# Patient Record
Sex: Male | Born: 1975 | Race: White | Hispanic: No | Marital: Single | Smoking: Current every day smoker
Health system: Southern US, Community
[De-identification: ages and names within clinical notes are randomized; demographics above are authoritative.]

---

## 2007-08-11 ENCOUNTER — Emergency Department: Payer: Self-pay | Admitting: Emergency Medicine

## 2008-01-28 ENCOUNTER — Emergency Department: Payer: Self-pay | Admitting: Emergency Medicine
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
## 2010-06-17 ENCOUNTER — Emergency Department: Payer: Self-pay | Admitting: Unknown Physician Specialty

## 2016-06-23 ENCOUNTER — Emergency Department
Admission: EM | Admit: 2016-06-23 | Discharge: 2016-06-23 | Disposition: A | Discharge status: Home / Self Care | Payer: Self-pay | Attending: Emergency Medicine | Admitting: Emergency Medicine

## 2016-06-23 ENCOUNTER — Encounter: Payer: Self-pay | Admitting: *Deleted

## 2016-06-23 DIAGNOSIS — K047 Periapical abscess without sinus: Secondary | ICD-10-CM | POA: Insufficient documentation

## 2016-06-23 DIAGNOSIS — F17210 Nicotine dependence, cigarettes, uncomplicated: Secondary | ICD-10-CM | POA: Insufficient documentation

## 2016-06-23 DIAGNOSIS — F1290 Cannabis use, unspecified, uncomplicated: Secondary | ICD-10-CM | POA: Insufficient documentation

## 2016-06-23 MED ORDER — AMOXICILLIN 500 MG PO CAPS: 500.0000 mg | ORAL_CAPSULE | Freq: Three times a day (TID) | ORAL | 0 refills | Status: AC

## 2016-06-23 MED ORDER — LIDOCAINE VISCOUS 2 % MT SOLN: 20.0000 mL | Freq: Four times a day (QID) | OROMUCOSAL | 0 refills | Status: AC | PRN

## 2016-06-23 MED ORDER — TRAMADOL HCL 50 MG PO TABS: 50.0000 mg | ORAL_TABLET | Freq: Four times a day (QID) | ORAL | 0 refills | Status: AC | PRN

## 2016-06-23 NOTE — ED Provider Notes (Signed)
[facility] Provider Note   CSN: [id number] Arrival date & time: 06/23/16  0809     History   Chief Complaint Chief Complaint  Patient presents with  . Dental Problem    HPI Cameron Le is a 40 y.o. male.  40 year old male presents today complaining of right lower jaw pain and swelling. Has a history of dental abscesses with similar symptoms. Seen by a dentist in [place] last month for similar symptoms and had some teeth pulled. No fevers, sweats or chills. No difficulty swallowing.    The history is provided by the patient.    History reviewed. No pertinent past medical history.  There are no active problems to display for this patient.   History reviewed. No pertinent surgical history.     Home Medications    Prior to Admission medications   Medication Sig Start Date End Date Taking? Authorizing Provider  amoxicillin (AMOXIL) 500 MG capsule Take 1 capsule (500 mg total) by mouth 3 (three) times daily. 06/23/16 06/30/16  Harvest Dark, PA-C  lidocaine (XYLOCAINE) 2 % solution Use as directed 20 mLs in the mouth or throat every 6 (six) hours as needed (dental pain). 06/23/16   Harvest Dark, PA-C  traMADol (ULTRAM) 50 MG tablet Take 1 tablet (50 mg total) by mouth every 6 (six) hours as needed. 06/23/16 06/23/17  Harvest Dark, PA-C    Family History No family history on file.  Social History Social History  Substance Use Topics  . Smoking status: Never Smoker  . Smokeless tobacco: Not on file  . Alcohol use No     Allergies   Ibuprofen   Review of Systems Review of Systems  Constitutional: Negative for chills and fever.  HENT: Positive for dental problem.   All other systems reviewed and are negative.    Physical Exam Updated Vital Signs BP (!) 152/95 (BP Location: Right Arm)   Pulse 73   Temp 98.1 F (36.7 C) (Oral)   Resp 18   Ht 5' 10"  (1.778 m)   Wt 68 kg   SpO2 99%   BMI 21.52 kg/m   Physical Exam    Constitutional: He appears well-developed and well-nourished.  HENT:  Head: Normocephalic and atraumatic.  Mouth/Throat: Uvula is midline, oropharynx is clear and moist and mucous membranes are normal. No trismus in the jaw. Abnormal dentition. Dental abscesses and dental caries present. No uvula swelling. No oropharyngeal exudate, posterior oropharyngeal edema or posterior oropharyngeal erythema.    Eyes: Conjunctivae are normal.  Neck: Normal range of motion. Neck supple.  Lymphadenopathy:    He has no cervical adenopathy.  Skin: Skin is warm and dry.  Psychiatric: He has a normal mood and affect. His behavior is normal. Judgment and thought content normal.     ED Treatments / Results  Labs (all labs ordered are listed, but only abnormal results are displayed) Labs Reviewed - No data to display  EKG  EKG Interpretation None       Radiology No results found.  Procedures Procedures (including critical care time)  Medications Ordered in ED Medications - No data to display   Initial Impression / Assessment and Plan / ED Course  I have reviewed the triage vital signs and the nursing notes.  Pertinent labs & imaging results that were available during my care of the patient were reviewed by me and considered in my medical decision making (see chart for details).  Clinical Course    Amoxicillin  549m TID x 7 days Viscous lido swish and spit Ultram 550m4-6 hr #20 no refills. Emphasized importance of follow up with dentist. Return for new or worsening symptoms   Final Clinical Impressions(s) / ED Diagnoses   Final diagnoses:  Dental abscess    New Prescriptions Discharge Medication List as of 06/23/2016  8:38 AM    START taking these medications   Details  amoxicillin (AMOXIL) 500 MG capsule Take 1 capsule (500 mg total) by mouth 3 (three) times daily., Starting Tue 06/23/2016, Until Tue 06/30/2016, Print    lidocaine (XYLOCAINE) 2 % solution Use as directed 20  mLs in the mouth or throat every 6 (six) hours as needed (dental pain)., Starting Tue 06/23/2016, Print    traMADol (ULTRAM) 50 MG tablet Take 1 tablet (50 mg total) by mouth every 6 (six) hours as needed., Starting Tue 06/23/2016, Until Wed 06/23/2017, Print         EmHarvest DarkPA-C 06/23/16 [id number]  JoEarleen NewportMD 06/23/16 1014

## 2016-06-23 NOTE — ED Triage Notes (Signed)
My complains of right sided dental pain with swelling to right side of face

## 2019-02-15 ENCOUNTER — Emergency Department
Admission: EM | Admit: 2019-02-15 | Discharge: 2019-02-15 | Disposition: A | Discharge status: Home / Self Care | Payer: Self-pay | Attending: Emergency Medicine | Admitting: Emergency Medicine

## 2019-02-15 ENCOUNTER — Other Ambulatory Visit: Payer: Self-pay

## 2019-02-15 ENCOUNTER — Encounter: Payer: Self-pay | Admitting: *Deleted

## 2019-02-15 ENCOUNTER — Emergency Department: Payer: Self-pay

## 2019-02-15 DIAGNOSIS — F17210 Nicotine dependence, cigarettes, uncomplicated: Secondary | ICD-10-CM | POA: Insufficient documentation

## 2019-02-15 DIAGNOSIS — R0602 Shortness of breath: Secondary | ICD-10-CM | POA: Insufficient documentation

## 2019-02-15 DIAGNOSIS — R079 Chest pain, unspecified: Secondary | ICD-10-CM | POA: Insufficient documentation

## 2019-02-15 DIAGNOSIS — Y908 Blood alcohol level of 240 mg/100 ml or more: Secondary | ICD-10-CM | POA: Insufficient documentation

## 2019-02-15 DIAGNOSIS — F1010 Alcohol abuse, uncomplicated: Secondary | ICD-10-CM | POA: Insufficient documentation

## 2019-02-15 LAB — COMPREHENSIVE METABOLIC PANEL
ALT: 22 U/L (ref 0–44)
AST: 27 U/L (ref 15–41)
Albumin: 4.6 g/dL (ref 3.5–5.0)
Alkaline Phosphatase: 59 U/L (ref 38–126)
Anion gap: 12 (ref 5–15)
BUN: 5 mg/dL — ABNORMAL LOW (ref 6–20)
CO2: 23 mmol/L (ref 22–32)
Calcium: 9.2 mg/dL (ref 8.9–10.3)
Chloride: 99 mmol/L (ref 98–111)
Creatinine, Ser: 0.67 mg/dL (ref 0.61–1.24)
GFR calc Af Amer: >60 mL/min (ref >60)
GFR calc non Af Amer: >60 mL/min (ref >60)
Glucose, Bld: 96 mg/dL (ref 70–99)
Potassium: 4.1 mmol/L (ref 3.5–5.1)
Sodium: 134 mmol/L — ABNORMAL LOW (ref 135–145)
Total Bilirubin: 0.5 mg/dL (ref 0.3–1.2)
Total Protein: 8.5 g/dL — ABNORMAL HIGH (ref 6.5–8.1)

## 2019-02-15 LAB — CBC WITH DIFFERENTIAL/PLATELET
Abs Immature Granulocytes: 0.02 10*3/uL (ref 0.00–0.07)
Basophils Absolute: 0.1 10*3/uL (ref 0.0–0.1)
Basophils Relative: 1 %
Eosinophils Absolute: 0.2 10*3/uL (ref 0.0–0.5)
Eosinophils Relative: 2 %
HCT: 44.9 % (ref 39.0–52.0)
Hemoglobin: 15.7 g/dL (ref 13.0–17.0)
Immature Granulocytes: 0 %
Lymphocytes Relative: 46 %
Lymphs Abs: 4.4 10*3/uL — ABNORMAL HIGH (ref 0.7–4.0)
MCH: 33.3 pg (ref 26.0–34.0)
MCHC: 35 g/dL (ref 30.0–36.0)
MCV: 95.1 fL (ref 80.0–100.0)
Monocytes Absolute: 0.6 10*3/uL (ref 0.1–1.0)
Monocytes Relative: 7 %
Neutro Abs: 4.2 10*3/uL (ref 1.7–7.7)
Neutrophils Relative %: 44 %
Platelets: 280 10*3/uL (ref 150–400)
RBC: 4.72 MIL/uL (ref 4.22–5.81)
RDW: 11.9 % (ref 11.5–15.5)
WBC: 9.4 10*3/uL (ref 4.0–10.5)
nRBC: 0 % (ref 0.0–0.2)

## 2019-02-15 LAB — URINE DRUG SCREEN, QUALITATIVE (ARMC ONLY)
Amphetamines, Ur Screen: NOT DETECTED
Barbiturates, Ur Screen: NOT DETECTED
Benzodiazepine, Ur Scrn: NOT DETECTED
Cannabinoid 50 Ng, Ur ~~LOC~~: NOT DETECTED
Cocaine Metabolite,Ur ~~LOC~~: NOT DETECTED
MDMA (Ecstasy)Ur Screen: NOT DETECTED
Methadone Scn, Ur: NOT DETECTED
Opiate, Ur Screen: NOT DETECTED
Phencyclidine (PCP) Ur S: NOT DETECTED
Tricyclic, Ur Screen: NOT DETECTED

## 2019-02-15 LAB — BRAIN NATRIURETIC PEPTIDE: B Natriuretic Peptide: 19 pg/mL (ref 0.0–100.0)

## 2019-02-15 LAB — ETHANOL: Alcohol, Ethyl (B): 247 mg/dL — ABNORMAL HIGH (ref <10)

## 2019-02-15 LAB — TROPONIN I: Troponin I: <0.03 ng/mL (ref <0.03)

## 2019-02-15 NOTE — ED Notes (Signed)
Rn instructed pt not to drive. Pt stated his son would pick him up.

## 2019-02-15 NOTE — Discharge Instructions (Signed)
Do not drive home, do not drink alcohol to excess, call the doctors above for outpatient care of this if you have increased pain shortness of breath or other concerns return to the emergency department.

## 2019-02-15 NOTE — ED Notes (Signed)
Pt can be heard yelling in his room

## 2019-02-15 NOTE — ED Notes (Signed)
Respirations appear even and unlabored but pt has trouble getting words out and speaking, this is not a new problem though

## 2019-02-15 NOTE — ED Notes (Signed)
PT wants to leave and just have results called to him. This RN encouraged pt to wait to speak with MD.

## 2019-02-15 NOTE — ED Triage Notes (Signed)
Pt ambulatory to triage.  Pt has sob and chest pain for 2-3 years.  Pt states he had a dream this week and was having chest pain and sob and thinks he is worse now.   Pt reports exertional sob.   Pt alert

## 2019-02-15 NOTE — ED Provider Notes (Addendum)
.[facility] Emergency Department Provider Note  ____________________________________________   I have reviewed the triage vital signs and the nursing notes. Where available I have reviewed prior notes and, if possible and indicated, outside hospital notes.    HISTORY  Chief Complaint Shortness of Breath    HPI Cameron Le is a 43 y.o. male who presents today complaining of chest pain and shortness of breath which is been going on for 3 years.  Mostly at night.  Wakes up and feels better.  He denies any exertional symptoms he is able to do his job with no difficulty.  He cannot describe what the pain is like.  He is a very poor historian.  He states "it just hurts sometimes".  He is not having the pain today.  He has been very anxious about it recently for various reasons and decided to come in to get checked out.  He denies any leg swelling, he denies any nausea or vomiting, he denies any weight loss or other symptoms.  He does drink 4-5 beers a day.  He is had some today.  He does not have a history of alcohol withdrawal.  He states he has a hoarse voice for "years" and is not different..  He has no pleuritic pain he has no personal family history of PE or DVT is no recent travel no leg swelling.  He does state sometimes at night he feels that there is pain coming on while he sleeps and when he wakes up it seems to get better.   No past medical history on file.  There are no active problems to display for this patient.   No past surgical history on file.  Prior to Admission medications   Medication Sig Start Date End Date Taking? Authorizing Provider  lidocaine (XYLOCAINE) 2 % solution Use as directed 20 mLs in the mouth or throat every 6 (six) hours as needed (dental pain). 06/23/16   Harvest Dark, PA-C    Allergies Ibuprofen  No family history on file.  Social History Social History   Tobacco Use  . Smoking status: Current Every Day  Smoker    Types: Cigarettes  . Smokeless tobacco: Never Used  Substance Use Topics  . Alcohol use: Yes    Comment: daily  . Drug use: Yes    Types: Marijuana    Review of Systems Constitutional: No fever/chills Eyes: No visual changes. ENT: No sore throat. No stiff neck no neck pain Cardiovascular: See HPI regarding chest pain. Respiratory: See HPI regarding shortness of breath. Gastrointestinal:   no vomiting.  No diarrhea.  No constipation. Genitourinary: Negative for dysuria. Musculoskeletal: Negative lower extremity swelling Skin: Negative for rash. Neurological: Negative for severe headaches, focal weakness or numbness.   ____________________________________________   PHYSICAL EXAM:  VITAL SIGNS: ED Triage Vitals [02/15/19 1751]  Enc Vitals Group     BP (!) 200/172     Pulse Rate 89     Resp 20     Temp 98.6 F (37 C)     Temp Source Oral     SpO2 99 %     Weight 145 lb (65.8 kg)     Height 5' 10"  (1.778 m)     Head Circumference      Peak Flow      Pain Score 10     Pain Loc      Pain Edu?      Excl. in [place]?  Constitutional: Alert and oriented. Well appearing and in no acute distress.  Quite anxious but not in any way toxic in appearance Eyes: Conjunctivae are normal Head: Atraumatic HEENT: No congestion/rhinnorhea. Mucous membranes are moist.  Oropharynx non-erythematous Neck:   Nontender with no meningismus, no masses, no stridor I do not palpate any masses Cardiovascular: Normal rate, regular rhythm. Grossly normal heart sounds.  Good peripheral circulation. Respiratory: Normal respiratory effort.  No retractions. Lungs CTAB. Abdominal: Soft and nontender. No distention. No guarding no rebound Back:  There is no focal tenderness or step off.  there is no midline tenderness there are no lesions noted. there is no CVA tenderness Musculoskeletal: No lower extremity tenderness, no upper extremity tenderness. No joint effusions, no DVT signs strong  distal pulses no edema Neurologic:  Normal speech and language. No gross focal neurologic deficits are appreciated.  Skin:  Skin is warm, dry and intact. No rash noted. Psychiatric: Mood and affect are anxious. Speech and behavior are normal.  ____________________________________________   LABS (all labs ordered are listed, but only abnormal results are displayed)  Labs Reviewed  CBC WITH DIFFERENTIAL/PLATELET  TROPONIN I  COMPREHENSIVE METABOLIC PANEL  ETHANOL  URINE DRUG SCREEN, QUALITATIVE ([place])  BRAIN NATRIURETIC PEPTIDE    Pertinent labs  results that were available during my care of the patient were reviewed by me and considered in my medical decision making (see chart for details). ____________________________________________  EKG  I personally interpreted any EKGs ordered by me or triage Sinus rhythm rate 83 beats minute no acute ST elevation depression normal axis, ____________________________________________  RADIOLOGY  Pertinent labs & imaging results that were available during my care of the patient were reviewed by me and considered in my medical decision making (see chart for details). If possible, patient and/or family made aware of any abnormal findings.  No results found. ____________________________________________    PROCEDURES  Procedure(s) performed: None  Procedures  Critical Care performed: None  ____________________________________________   INITIAL IMPRESSION / ASSESSMENT AND PLAN / ED COURSE  Pertinent labs & imaging results that were available during my care of the patient were reviewed by me and considered in my medical decision making (see chart for details).  Patient with chest pain mostly at night for 3 years, is very anxious about it.  Had a dream about him.  Wants to be evaluated.  He is awake and alert no acute distress, no evidence of acute psychiatric or other decompensation although he does express a great deal of concern  about his chronic poorly described chest pain.  Low suspicion for ACS PE or dissection given his history we will send cardiac enzymes get chest x-ray or obtain EKG basic blood work including BNP and we will see if there is anything that needs to be done today.  If not, given chronicity of complaint, it is my hope that we can get adequate outpatient cardiology follow-up for stress echo in the future  ----------------------------------------- 8:10 PM on 02/15/2019 -----------------------------------------  Allergy work-up is negative however patient's EtOH is certainly quite positive which I think probably contributes to his anxiety that his chronic discomfort in his chest.  We will observe him to make sure he is safe for discharge I do not think serial enzymes are utility.  Patient in no acute distress it is my hope we can get him safely home soon.  ----------------------------------------- 8:36 PM on 02/15/2019 -----------------------------------------  Patient has a ride waiting in the parking lot it is demanding discharge he is  awake and alert able to ambulate, I think clinically he is safe for discharge from terms of his intoxication, he refuses to stay and I do not think I am in a position to force him to stay here especially given that there may be other patients who are infected with the coronavirus in the department.  We will discharge him therefore with his son to drive him.  He is understanding the return precautions follow-up disposition instructions and he understands he must not drive    ____________________________________________   FINAL CLINICAL IMPRESSION(S) / ED DIAGNOSES  Final diagnoses:  None      This chart was dictated using voice recognition software.  Despite best efforts to proofread,  errors can occur which can change meaning.      Schuyler Amor, MD 02/15/19 2010    Schuyler Amor, MD 02/15/19 2036

## 2019-04-19 ENCOUNTER — Emergency Department
Admission: EM | Admit: 2019-04-19 | Discharge: 2019-04-19 | Discharge status: Against Medical Advice | Payer: Self-pay | Attending: Emergency Medicine | Admitting: Emergency Medicine

## 2019-04-19 DIAGNOSIS — Z5321 Procedure and treatment not carried out due to patient leaving prior to being seen by health care provider: Secondary | ICD-10-CM | POA: Insufficient documentation

## 2019-04-19 DIAGNOSIS — T63511A Toxic effect of contact with stingray, accidental (unintentional), initial encounter: Secondary | ICD-10-CM | POA: Insufficient documentation

## 2019-04-19 NOTE — ED Notes (Signed)
Patient states he does not want to wait for MD. States he is going to leave. Advised that MD will see patient. He states he does not want to be seen. Removed patients wrist band. Patient ambulated out of ED with steady gate

## 2019-10-03 ENCOUNTER — Other Ambulatory Visit: Payer: Self-pay

## 2019-10-03 DIAGNOSIS — Z20822 Contact with and (suspected) exposure to covid-19: Secondary | ICD-10-CM

## 2019-10-05 LAB — NOVEL CORONAVIRUS, NAA: SARS-CoV-2, NAA: NOT DETECTED

## 2020-01-31 ENCOUNTER — Encounter (INDEPENDENT_AMBULATORY_CARE_PROVIDER_SITE_OTHER): Payer: Self-pay

## 2020-03-21 IMAGING — DX PORTABLE CHEST - 1 VIEW
1 series · 1 of 1 positions shown · non-contrast
Comparison: None.

CLINICAL DATA: 42-year-old male with shortness of breath and chest
pain

EXAM:
PORTABLE CHEST 1 VIEW

[chest ap]
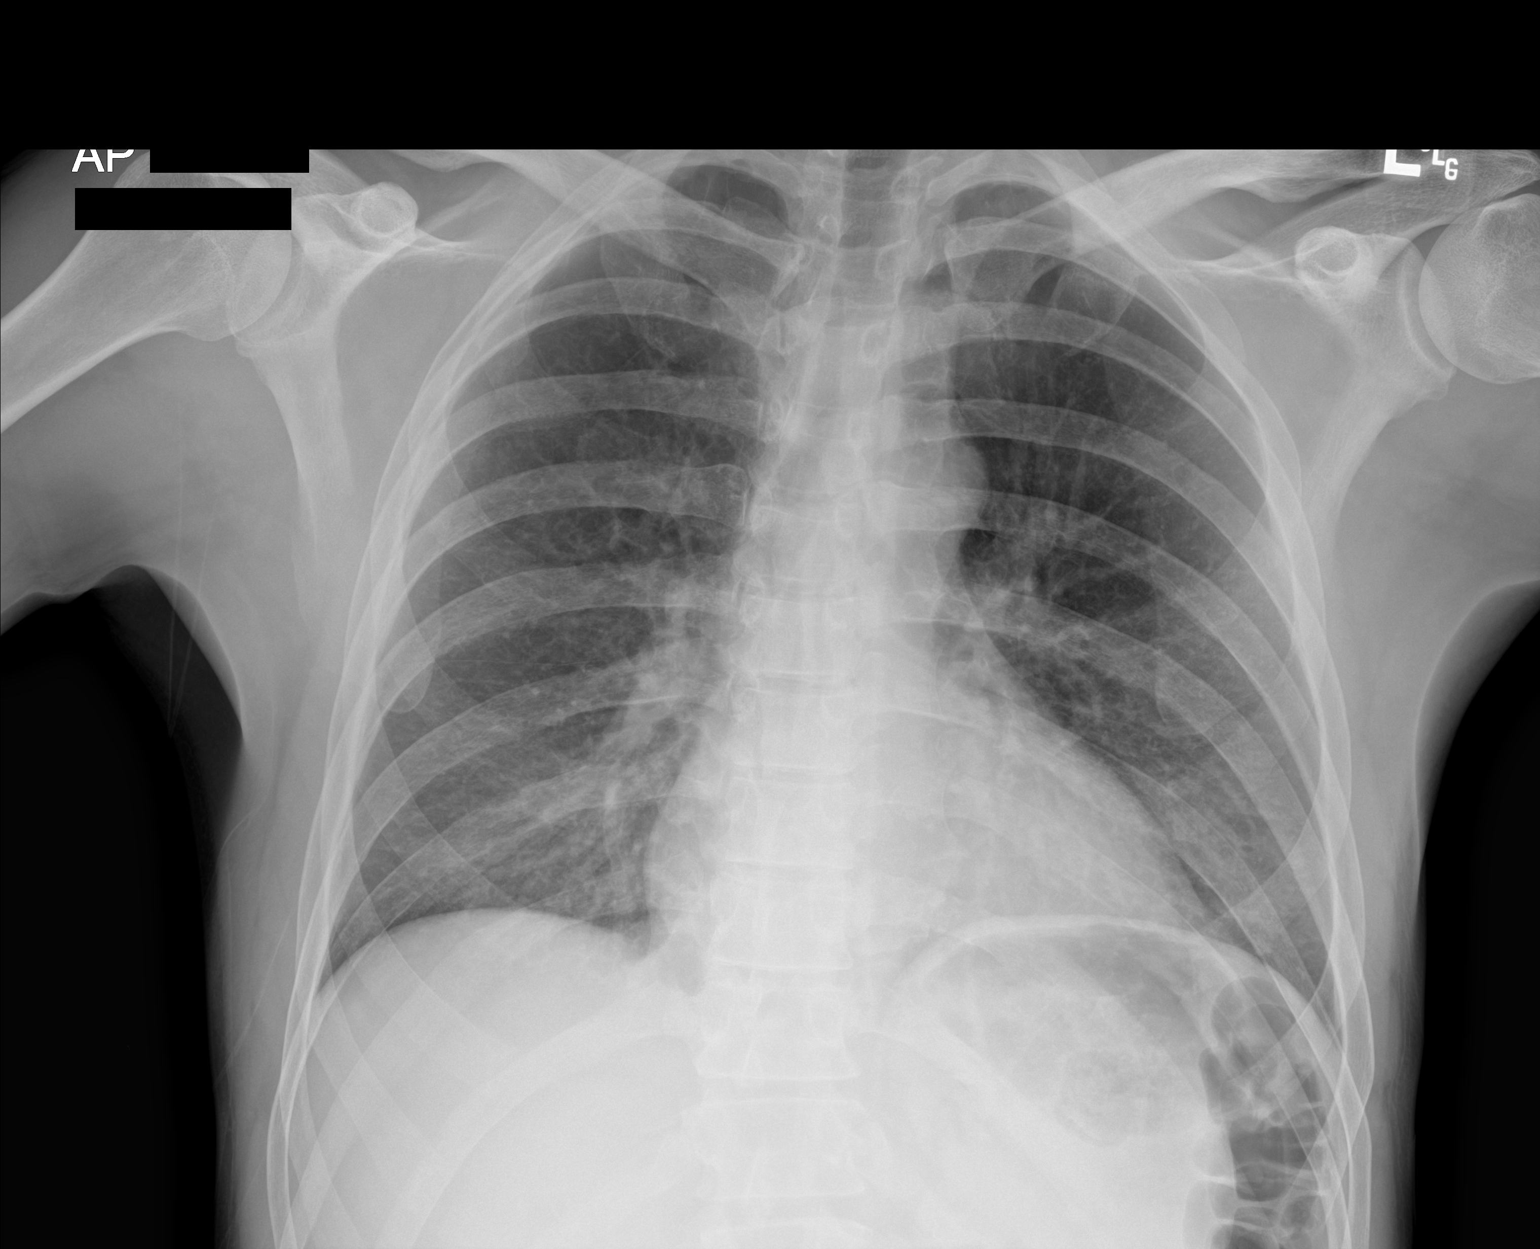

[1 of 1 positions shown; findings below may reference images not displayed]

FINDINGS: Cardiomediastinal silhouette within normal limits. No evidence of
central vascular congestion. No pneumothorax. No pleural effusion.
Reticular opacities of the lower lungs. No comparison. No displaced
fracture.
IMPRESSION: Mild reticular opacity of the lower lungs, potentially
bronchitis/infection versus chronic changes.
